# Patient Record
Sex: Female | Born: 1985 | Hispanic: Yes | Marital: Single | Smoking: Former smoker
Health system: Southern US, Community
[De-identification: ages and names within clinical notes are randomized; demographics above are authoritative.]

## PROBLEM LIST (undated history)

## (undated) DIAGNOSIS — J40 Bronchitis, not specified as acute or chronic: Secondary | ICD-10-CM

---

## 2017-01-21 ENCOUNTER — Emergency Department: Payer: Self-pay

## 2017-01-21 ENCOUNTER — Emergency Department
Admission: EM | Admit: 2017-01-21 | Discharge: 2017-01-21 | Disposition: A | Discharge status: Home / Self Care | Payer: Self-pay | Attending: Emergency Medicine | Admitting: Emergency Medicine

## 2017-01-21 ENCOUNTER — Encounter: Payer: Self-pay | Admitting: Emergency Medicine

## 2017-01-21 DIAGNOSIS — B9789 Other viral agents as the cause of diseases classified elsewhere: Secondary | ICD-10-CM

## 2017-01-21 DIAGNOSIS — Z87891 Personal history of nicotine dependence: Secondary | ICD-10-CM | POA: Insufficient documentation

## 2017-01-21 DIAGNOSIS — J069 Acute upper respiratory infection, unspecified: Secondary | ICD-10-CM | POA: Insufficient documentation

## 2017-01-21 HISTORY — DX: Bronchitis, not specified as acute or chronic: J40

## 2017-01-21 LAB — POCT PREGNANCY, URINE: PREG TEST UR: NEGATIVE

## 2017-01-21 MED ORDER — ONDANSETRON 4 MG PO TBDP: ORAL_TABLET | ORAL | 0 refills | Status: DC

## 2017-01-21 MED ORDER — BENZONATATE 100 MG PO CAPS: 100.0000 mg | ORAL_CAPSULE | Freq: Three times a day (TID) | ORAL | 0 refills | Status: AC | PRN

## 2017-01-21 MED ORDER — HYDROCODONE-HOMATROPINE 5-1.5 MG/5ML PO SYRP: 5.0000 mL | ORAL_SOLUTION | Freq: Four times a day (QID) | ORAL | 0 refills | Status: DC | PRN

## 2017-01-21 MED ORDER — ONDANSETRON 4 MG PO TBDP: ORAL_TABLET | ORAL | 0 refills | Status: AC

## 2017-01-21 NOTE — ED Notes (Signed)
Patient c/o congestion, anorexia, diarrhea, productive cough,  throat irritation/tightness beginning Saturday. Pt reports on Saturday her throat felt like it closed up entirely, she ran outside and then began to vomit. Patient c/o hoarseness beginning Friday.

## 2017-01-21 NOTE — ED Provider Notes (Signed)
[facility] Emergency Department Provider Note  ____________________________________________   First MD Initiated Contact with Patient 01/21/17 [phone]     (approximate)  I have reviewed the triage vital signs and the nursing notes.   HISTORY  Chief Complaint Cough    HPI Robyn Thompson is a 31 y.o. female who lives in [place] but traveled about a week ago to [place] to stay with family and presents for evaluation of viral symptoms that have been gradual in onset over the last week.  Her symptoms include heavy postnasal drip, nasal congestion, sore throat, hoarse voice, frequent cough, nausea, decreased appetite, and diarrhea.  She is not aware of any fever and has not had any chills.  She denies abdominal pain.  She has been taking over-the-counter allergy medicine and DayQuil but it is not helping.  Nothing in particular makes the symptoms worse.  She is not having any trouble breathing.  She describes her symptoms as moderate.  She is concerned she may have pneumonia.   Past Medical History:  Diagnosis Date  . Bronchitis     There are no active problems to display for this patient.   History reviewed. No pertinent surgical history.  Prior to Admission medications   Medication Sig Start Date End Date Taking? Authorizing Provider  benzonatate (TESSALON PERLES) 100 MG capsule Take 1 capsule (100 mg total) by mouth 3 (three) times daily as needed for cough. 01/21/17   Loleta Rose, MD  ondansetron (ZOFRAN ODT) 4 MG disintegrating tablet Allow 1-2 tablets to dissolve in your mouth every 8 hours as needed for nausea/vomiting 01/21/17   Loleta Rose, MD    Allergies Hydrocodone and Oxycodone  History reviewed. No pertinent family history.  Social History Social History  Substance Use Topics  . Smoking status: Former Games developer  . Smokeless tobacco: Never Used  . Alcohol use Yes    Review of Systems Constitutional: No fever/chills Eyes: No visual  changes. ENT: No sore throat. Cardiovascular: Denies chest pain. Respiratory: Denies shortness of breath. Gastrointestinal: No abdominal pain.  No nausea, no vomiting.  No diarrhea.  No constipation. Genitourinary: Negative for dysuria. Musculoskeletal: Negative for back pain. Skin: Negative for rash. Neurological: Negative for headaches, focal weakness or numbness.  10-point ROS otherwise negative.  ____________________________________________   PHYSICAL EXAM:  VITAL SIGNS: ED Triage Vitals  Enc Vitals Group     BP 01/21/17 0209 (!) 121/107     Pulse Rate 01/21/17 0209 84     Resp 01/21/17 0209 16     Temp 01/21/17 0209 98.3 F (36.8 C)     Temp Source 01/21/17 0209 Oral     SpO2 01/21/17 0209 100 %     Weight 01/21/17 0210 166 lb (75.3 kg)     Height 01/21/17 0210  (1.626 m)     Head Circumference --      Peak Flow --      Pain Score 01/21/17 0209 0     Pain Loc --      Pain Edu? --      Excl. in GC? --     Constitutional: Alert and oriented. Well appearing and in no acute distress. Eyes: Conjunctivae are normal. PERRL. EOMI. Head: Atraumatic. Ears:  Healthy appearing ear canals and TMs bilaterally Nose: No congestion/rhinnorhea. Mouth/Throat: Mucous membranes are moist.  Oropharynx Mildly erythematous with no exudate nor petechiae on the palate Neck: No stridor.  No meningeal signs.   Cardiovascular: Normal rate, regular rhythm. Good peripheral  circulation. Grossly normal heart sounds. Respiratory: Normal respiratory effort.  No retractions. Lungs CTAB. Gastrointestinal: Soft and nontender. No distention.  Musculoskeletal: No lower extremity tenderness nor edema. No gross deformities of extremities. Neurologic:  Normal speech and language. No gross focal neurologic deficits are appreciated.  Skin:  Skin is warm, dry and intact. No rash noted. Psychiatric: Mood and affect are normal. Speech and behavior are  normal.  ____________________________________________   LABS (all labs ordered are listed, but only abnormal results are displayed)  Labs Reviewed  POCT PREGNANCY, URINE   ____________________________________________  EKG  None - EKG not ordered by ED physician ____________________________________________  RADIOLOGY   Dg Chest 2 View  Result Date: 01/21/2017 CLINICAL DATA:  Cough and sore throat for 1 week. EXAM: CHEST  2 VIEW COMPARISON:  None. FINDINGS: The lungs are clear. The pulmonary vasculature is normal. Heart size is normal. Hilar and mediastinal contours are unremarkable. There is no pleural effusion. IMPRESSION: No active cardiopulmonary disease. Electronically Signed   By: Ellery Plunk M.D.   On: 01/21/2017 02:38    ____________________________________________   PROCEDURES  Critical Care performed: No   Procedure(s) performed:   Procedures   ____________________________________________   INITIAL IMPRESSION / ASSESSMENT AND PLAN / ED COURSE  Pertinent labs & imaging results that were available during my care of the patient were reviewed by me and considered in my medical decision making (see chart for details).  General viral URI symptoms with no evidence of pneumonia on chest x-ray.  Stable vital signs.  No evidence of bacterial pharyngitis or deep neck infection or abscess.  I had my usual and customary viral URI discussion.  I gave my usual and customary return precautions.  Patient is comfortable with the plan for symptomatic treatment and outpatient follow-up.      ____________________________________________  FINAL CLINICAL IMPRESSION(S) / ED DIAGNOSES  Final diagnoses:  Viral URI with cough     MEDICATIONS GIVEN DURING THIS VISIT:  Medications - No data to display   NEW OUTPATIENT MEDICATIONS STARTED DURING THIS VISIT:  Discharge Medication List as of 01/21/2017  3:59 AM    START taking these medications   Details  benzonatate  (TESSALON PERLES) 100 MG capsule Take 1 capsule (100 mg total) by mouth 3 (three) times daily as needed for cough., Starting Sat 01/21/2017, Print        Discharge Medication List as of 01/21/2017  3:59 AM    CONTINUE these medications which have CHANGED   Details  ondansetron (ZOFRAN ODT) 4 MG disintegrating tablet Allow 1-2 tablets to dissolve in your mouth every 8 hours as needed for nausea/vomiting, Print        Discharge Medication List as of 01/21/2017  3:59 AM       Note:  This document was prepared using Dragon voice recognition software and may include unintentional dictation errors.    Loleta Rose, MD 01/21/17 [phone]

## 2017-01-21 NOTE — ED Triage Notes (Signed)
Pt to triage via WC via EMS from home, report cough x 1 week, reports sore throat with the cough, denies chest pain.  Reports taking OTC allergy and dayquil at home w/o relief.

## 2017-01-21 NOTE — Discharge Instructions (Signed)

## 2017-01-21 NOTE — ED Notes (Signed)
Report from jenna, rn.  

## 2018-04-22 IMAGING — CR DG CHEST 2V
2 series · 2 of 2 positions shown · non-contrast
Comparison: None.

CLINICAL DATA: Cough and sore throat for 1 week.

EXAM:
CHEST  2 VIEW

[chest pa]
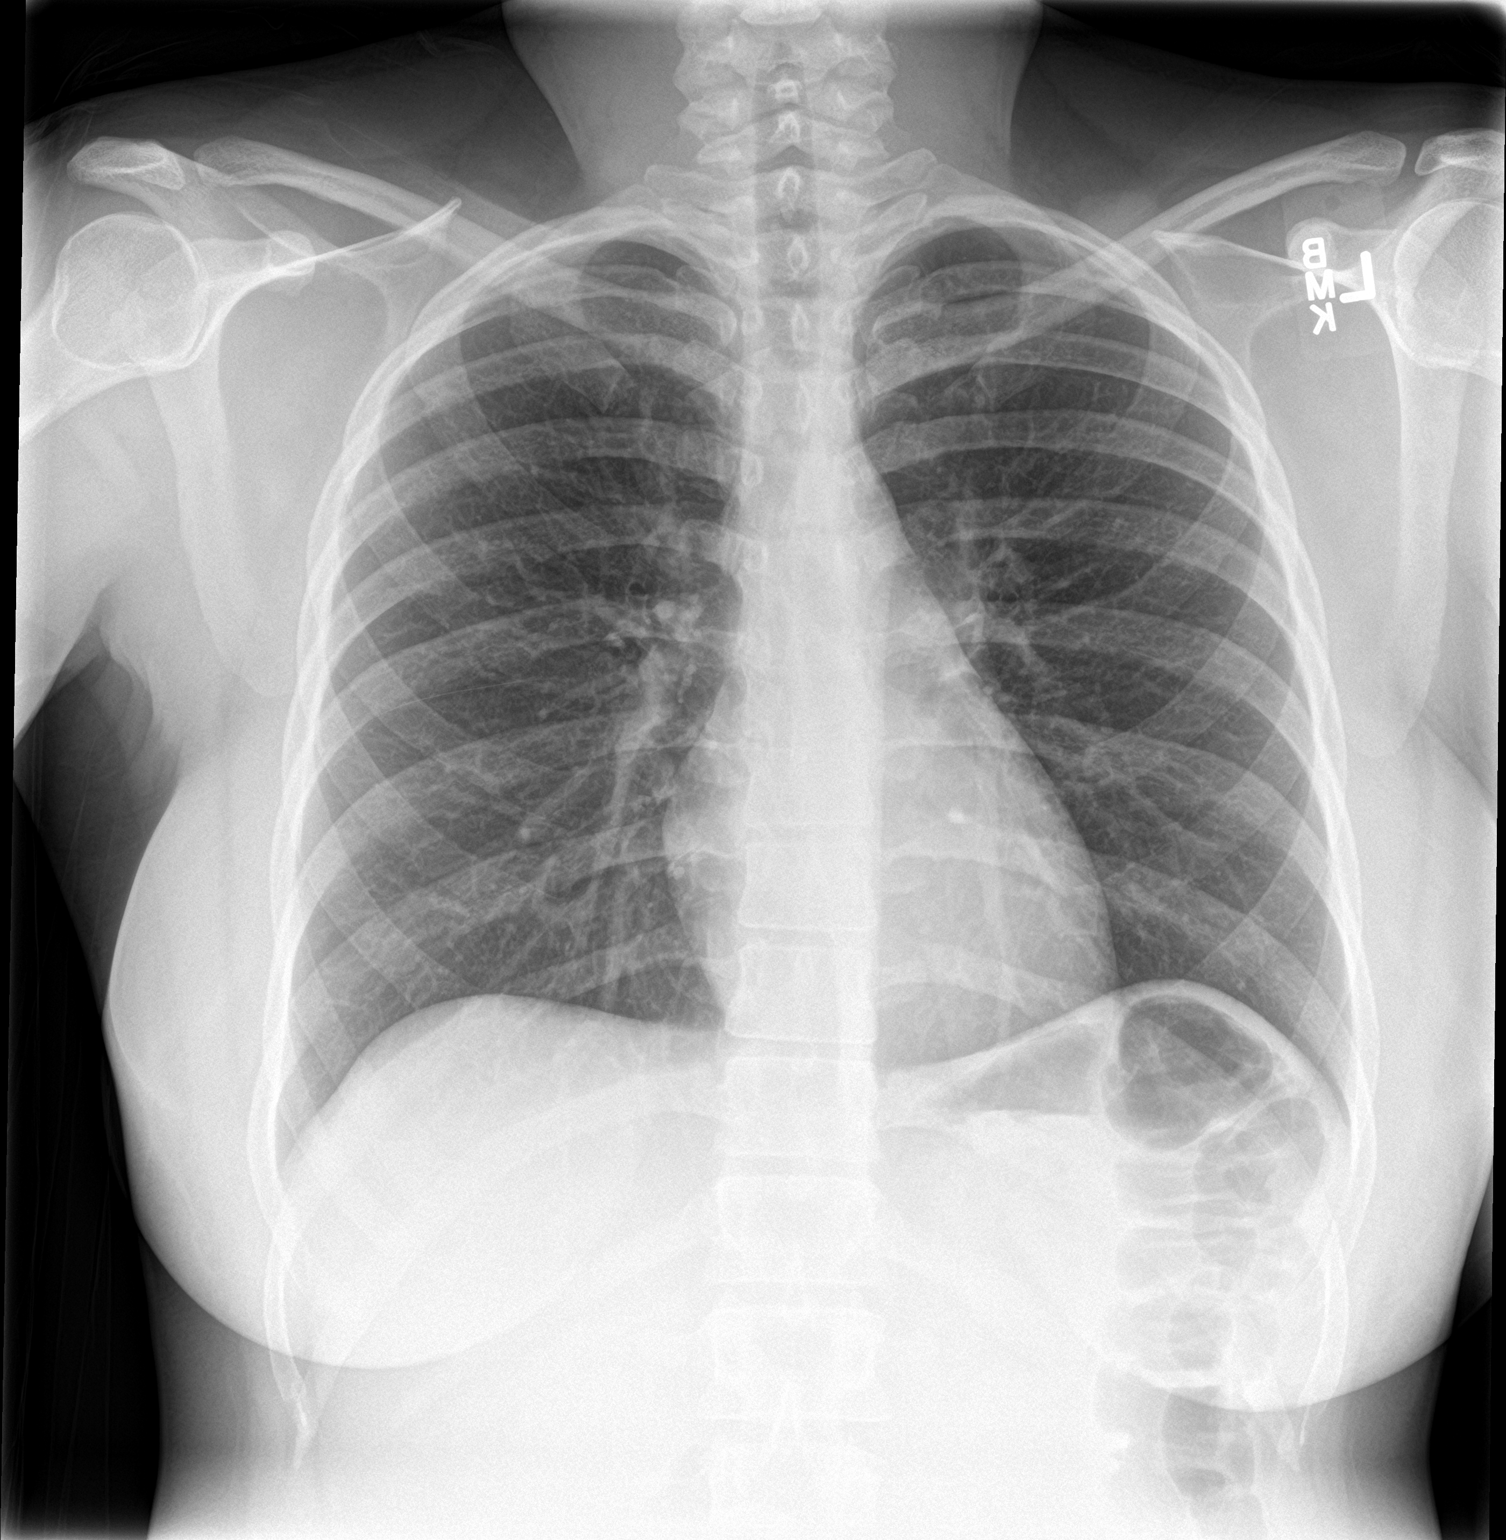

[chest lat]
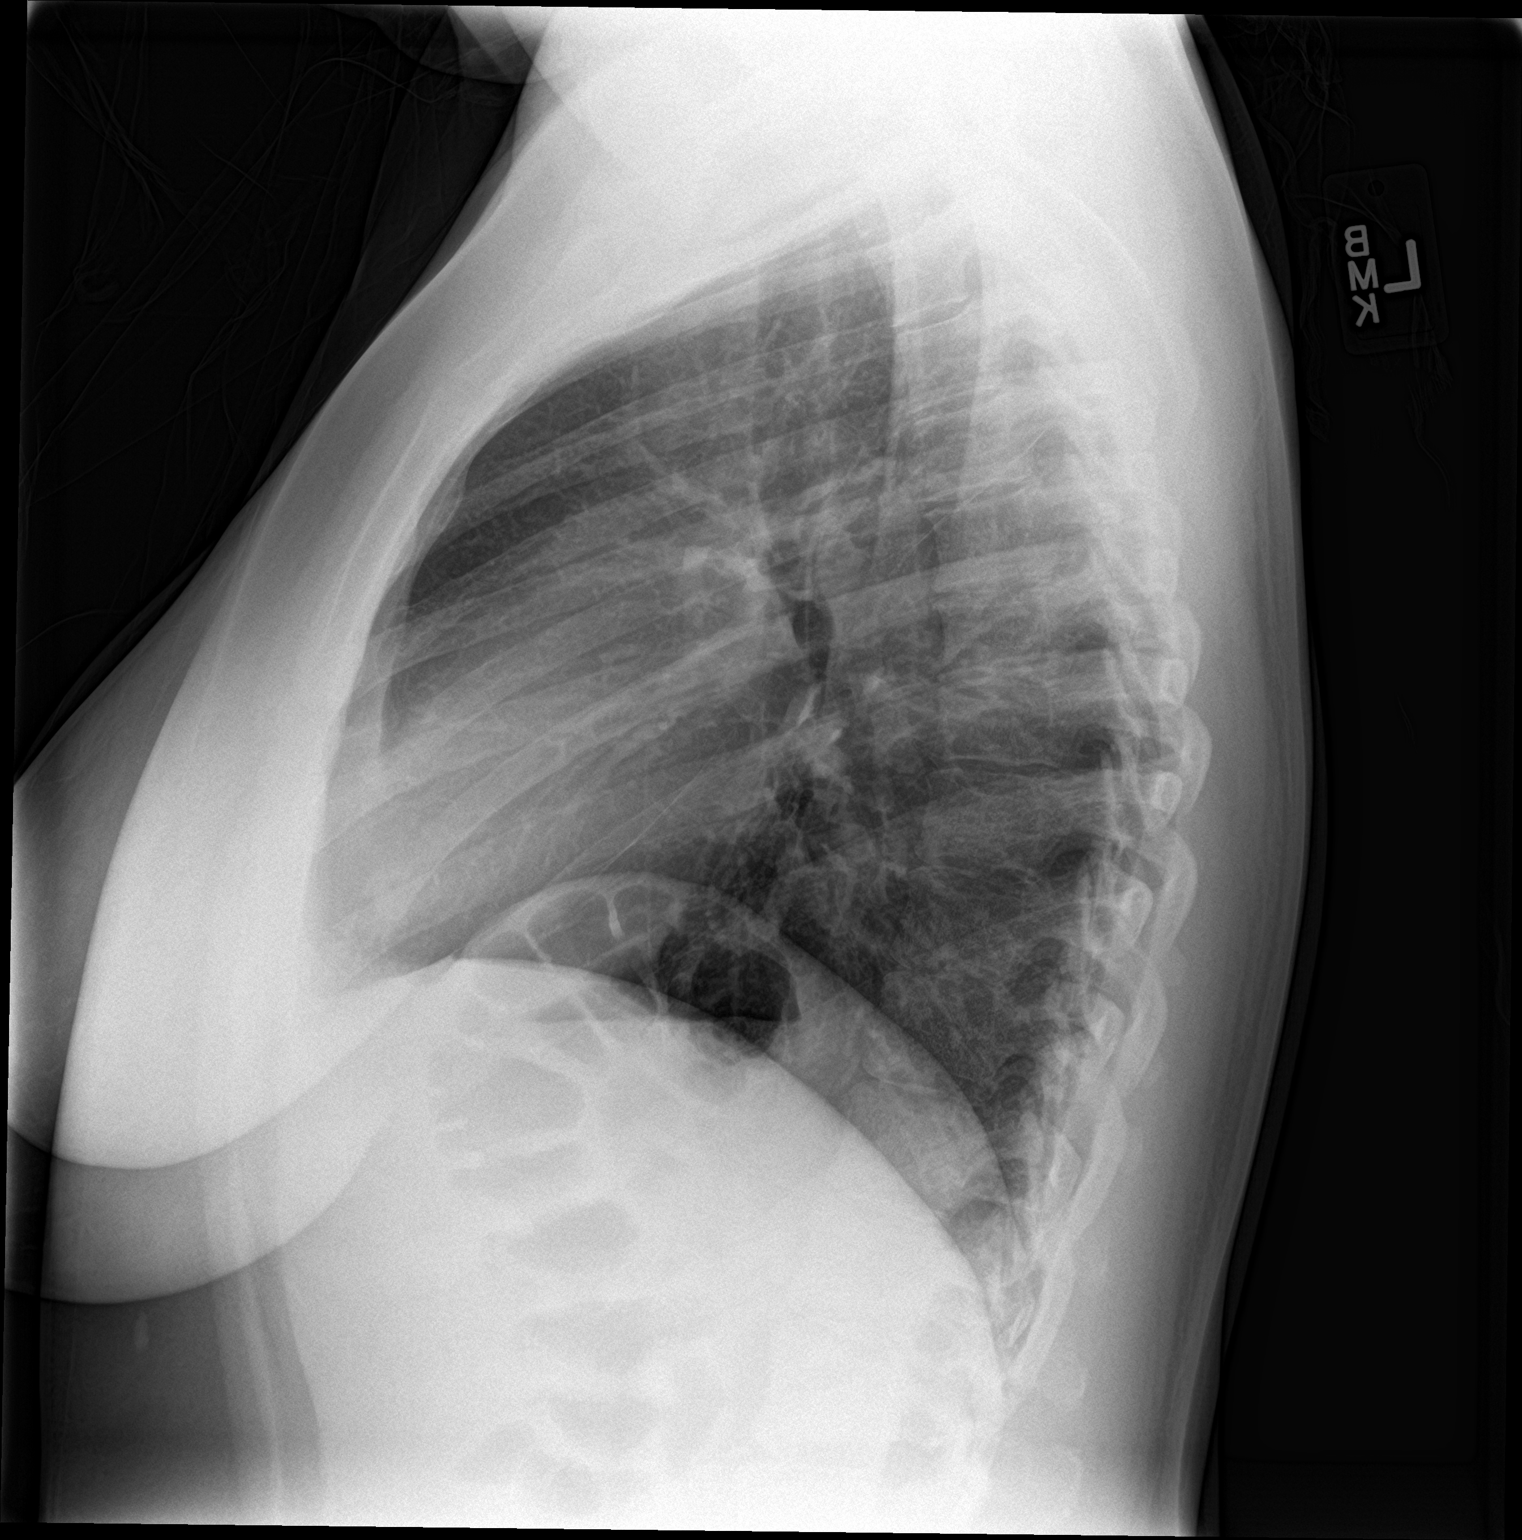

[2 of 2 positions shown; findings below may reference images not displayed]

FINDINGS: The lungs are clear. The pulmonary vasculature is normal. Heart size
is normal. Hilar and mediastinal contours are unremarkable. There is
no pleural effusion.
IMPRESSION: No active cardiopulmonary disease.
